# Patient Record
Sex: Male | Born: 1960 | Race: White | Hispanic: No | Marital: Married | Smoking: Former smoker
Health system: Southern US, Community
[De-identification: ages and names within clinical notes are randomized; demographics above are authoritative.]

## PROBLEM LIST (undated history)

## (undated) DIAGNOSIS — R55 Syncope and collapse: Secondary | ICD-10-CM

## (undated) DIAGNOSIS — I2510 Atherosclerotic heart disease of native coronary artery without angina pectoris: Secondary | ICD-10-CM

## (undated) DIAGNOSIS — I5032 Chronic diastolic (congestive) heart failure: Secondary | ICD-10-CM

## (undated) DIAGNOSIS — F1011 Alcohol abuse, in remission: Secondary | ICD-10-CM

## (undated) DIAGNOSIS — N200 Calculus of kidney: Secondary | ICD-10-CM

## (undated) DIAGNOSIS — E669 Obesity, unspecified: Secondary | ICD-10-CM

## (undated) DIAGNOSIS — R110 Nausea: Secondary | ICD-10-CM

## (undated) HISTORY — PX: GASTRIC BYPASS: SHX52

## (undated) HISTORY — PX: ULNAR NERVE TRANSPOSITION: SHX2595

## (undated) HISTORY — PX: REPLACEMENT TOTAL KNEE: SUR1224

## (undated) HISTORY — PX: LITHOTRIPSY: SUR834

---

## 2015-07-13 ENCOUNTER — Observation Stay
Admission: EM | Admit: 2015-07-13 | Discharge: 2015-07-14 | Disposition: A | Discharge status: Home / Self Care | Payer: BLUE CROSS/BLUE SHIELD | Attending: Internal Medicine | Admitting: Internal Medicine

## 2015-07-13 ENCOUNTER — Emergency Department: Payer: BLUE CROSS/BLUE SHIELD

## 2015-07-13 ENCOUNTER — Encounter: Payer: Self-pay | Admitting: Emergency Medicine

## 2015-07-13 DIAGNOSIS — R42 Dizziness and giddiness: Secondary | ICD-10-CM | POA: Insufficient documentation

## 2015-07-13 DIAGNOSIS — E876 Hypokalemia: Secondary | ICD-10-CM | POA: Diagnosis not present

## 2015-07-13 DIAGNOSIS — R002 Palpitations: Secondary | ICD-10-CM | POA: Diagnosis not present

## 2015-07-13 DIAGNOSIS — F1010 Alcohol abuse, uncomplicated: Secondary | ICD-10-CM | POA: Insufficient documentation

## 2015-07-13 DIAGNOSIS — Z801 Family history of malignant neoplasm of trachea, bronchus and lung: Secondary | ICD-10-CM | POA: Diagnosis not present

## 2015-07-13 DIAGNOSIS — R079 Chest pain, unspecified: Secondary | ICD-10-CM | POA: Diagnosis not present

## 2015-07-13 DIAGNOSIS — Z8249 Family history of ischemic heart disease and other diseases of the circulatory system: Secondary | ICD-10-CM | POA: Diagnosis not present

## 2015-07-13 DIAGNOSIS — R319 Hematuria, unspecified: Secondary | ICD-10-CM | POA: Diagnosis not present

## 2015-07-13 DIAGNOSIS — Z888 Allergy status to other drugs, medicaments and biological substances status: Secondary | ICD-10-CM | POA: Diagnosis not present

## 2015-07-13 DIAGNOSIS — R0781 Pleurodynia: Secondary | ICD-10-CM | POA: Diagnosis not present

## 2015-07-13 DIAGNOSIS — I2510 Atherosclerotic heart disease of native coronary artery without angina pectoris: Secondary | ICD-10-CM | POA: Diagnosis not present

## 2015-07-13 DIAGNOSIS — E669 Obesity, unspecified: Secondary | ICD-10-CM | POA: Diagnosis not present

## 2015-07-13 DIAGNOSIS — R55 Syncope and collapse: Secondary | ICD-10-CM | POA: Diagnosis present

## 2015-07-13 DIAGNOSIS — Z87442 Personal history of urinary calculi: Secondary | ICD-10-CM | POA: Insufficient documentation

## 2015-07-13 DIAGNOSIS — K219 Gastro-esophageal reflux disease without esophagitis: Secondary | ICD-10-CM | POA: Diagnosis not present

## 2015-07-13 DIAGNOSIS — Z79899 Other long term (current) drug therapy: Secondary | ICD-10-CM | POA: Insufficient documentation

## 2015-07-13 DIAGNOSIS — D649 Anemia, unspecified: Secondary | ICD-10-CM | POA: Diagnosis not present

## 2015-07-13 DIAGNOSIS — Z833 Family history of diabetes mellitus: Secondary | ICD-10-CM | POA: Insufficient documentation

## 2015-07-13 DIAGNOSIS — Z9884 Bariatric surgery status: Secondary | ICD-10-CM | POA: Insufficient documentation

## 2015-07-13 DIAGNOSIS — F329 Major depressive disorder, single episode, unspecified: Secondary | ICD-10-CM | POA: Insufficient documentation

## 2015-07-13 DIAGNOSIS — I5032 Chronic diastolic (congestive) heart failure: Secondary | ICD-10-CM | POA: Diagnosis not present

## 2015-07-13 DIAGNOSIS — R0602 Shortness of breath: Secondary | ICD-10-CM | POA: Diagnosis present

## 2015-07-13 DIAGNOSIS — Z87891 Personal history of nicotine dependence: Secondary | ICD-10-CM | POA: Diagnosis not present

## 2015-07-13 HISTORY — DX: Chronic diastolic (congestive) heart failure: I50.32

## 2015-07-13 HISTORY — DX: Obesity, unspecified: E66.9

## 2015-07-13 HISTORY — DX: Syncope and collapse: R55

## 2015-07-13 HISTORY — DX: Alcohol abuse, in remission: F10.11

## 2015-07-13 HISTORY — DX: Calculus of kidney: N20.0

## 2015-07-13 HISTORY — DX: Nausea: R11.0

## 2015-07-13 HISTORY — DX: Atherosclerotic heart disease of native coronary artery without angina pectoris: I25.10

## 2015-07-13 LAB — BASIC METABOLIC PANEL
Anion gap: 8 (ref 5–15)
BUN: 27 mg/dL — ABNORMAL HIGH (ref 6–20)
CALCIUM: 8.8 mg/dL — AB (ref 8.9–10.3)
CO2: 26 mmol/L (ref 22–32)
CREATININE: 0.69 mg/dL (ref 0.61–1.24)
Chloride: 104 mmol/L (ref 101–111)
GFR calc Af Amer: >60 mL/min (ref >60)
GFR calc non Af Amer: >60 mL/min (ref >60)
GLUCOSE: 106 mg/dL — AB (ref 65–99)
Potassium: 4.4 mmol/L (ref 3.5–5.1)
Sodium: 138 mmol/L (ref 135–145)

## 2015-07-13 LAB — URINALYSIS COMPLETE WITH MICROSCOPIC (ARMC ONLY)
BILIRUBIN URINE: NEGATIVE
GLUCOSE, UA: NEGATIVE mg/dL
Leukocytes, UA: NEGATIVE
Nitrite: NEGATIVE
PH: 5 (ref 5.0–8.0)
Protein, ur: 30 mg/dL — AB
Specific Gravity, Urine: 1.028 (ref 1.005–1.030)

## 2015-07-13 LAB — TROPONIN I
TROPONIN I: 0.04 ng/mL — AB (ref <0.031)
Troponin I: <0.03 ng/mL (ref <0.031)

## 2015-07-13 LAB — CBC
HCT: 35.6 % — ABNORMAL LOW (ref 40.0–52.0)
Hemoglobin: 11.5 g/dL — ABNORMAL LOW (ref 13.0–18.0)
MCH: 28 pg (ref 26.0–34.0)
MCHC: 32.3 g/dL (ref 32.0–36.0)
MCV: 86.8 fL (ref 80.0–100.0)
PLATELETS: 419 10*3/uL (ref 150–440)
RBC: 4.1 MIL/uL — ABNORMAL LOW (ref 4.40–5.90)
RDW: 14.5 % (ref 11.5–14.5)
WBC: 14.5 10*3/uL — ABNORMAL HIGH (ref 3.8–10.6)

## 2015-07-13 MED ORDER — DOCUSATE SODIUM 100 MG PO CAPS
100.0000 mg | ORAL_CAPSULE | Freq: Two times a day (BID) | ORAL | Status: DC
  Filled 2015-07-13: qty 1

## 2015-07-13 MED ORDER — ONDANSETRON HCL 4 MG/2ML IJ SOLN: 4.0000 mg | Freq: Four times a day (QID) | INTRAMUSCULAR | Status: DC | PRN

## 2015-07-13 MED ORDER — ENOXAPARIN SODIUM 40 MG/0.4ML ~~LOC~~ SOLN
40.0000 mg | Freq: Two times a day (BID) | SUBCUTANEOUS | Status: DC
  Filled 2015-07-13: qty 0.4

## 2015-07-13 MED ORDER — ASPIRIN 81 MG PO CHEW
CHEWABLE_TABLET | ORAL | Status: AC
  Filled 2015-07-13: qty 4

## 2015-07-13 MED ORDER — SODIUM CHLORIDE 0.9 % IJ SOLN
3.0000 mL | Freq: Two times a day (BID) | INTRAMUSCULAR | Status: DC
  Administered 2015-07-13: 3 mL via INTRAVENOUS

## 2015-07-13 MED ORDER — ONDANSETRON HCL 4 MG PO TABS: 4.0000 mg | ORAL_TABLET | Freq: Four times a day (QID) | ORAL | Status: DC | PRN

## 2015-07-13 MED ORDER — ASPIRIN 81 MG PO CHEW
324.0000 mg | CHEWABLE_TABLET | Freq: Once | ORAL | Status: AC
  Administered 2015-07-13: 324 mg via ORAL

## 2015-07-13 NOTE — ED Notes (Signed)
Pt was at work and had been dizzy intermittently.  Had syncopal episode at work today.  Unsure how long was out but boss witnessed. Coworkers told pt his eyes rolled back and he was pale and clammy.  Skin warm and dry in triage. Skin pink in color.

## 2015-07-13 NOTE — H&P (Signed)
[facility] - [place] [facility]   PATIENT NAME: Phillip Arroyo    MR#:  [id number]  DATE OF BIRTH:  12/25/1960  DATE OF ADMISSION:  07/13/2015  PRIMARY CARE PHYSICIAN: No primary care provider on file.   REQUESTING/REFERRING PHYSICIAN: [email or web address]  CHIEF COMPLAINT:   Dizziness and syncope HISTORY OF PRESENT ILLNESS:  Phillip Arroyo  is a 55 y.o. male with a known past medical history of alcohol abuse, currently on alcohol rehabilitation, last alcohol drink on December 1 is presenting to the ED with a chief complaint of dizziness and passing out. According to the patient he is a recovering alcoholic, his last drink was 47 days ago. He recently went through a 28 day inpatient detox. He states during his detox he had 2 falls in which he became near syncopal and fell. They had attributed this to over medication with Ativan. 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 Fell backwards, likely hit his head but denies any headache. Denies any chest pain at any time. Per the patient co-workers, he was pale and clammy at the time of passing out.Patient and wife were reporting that they usually don't take any prescribed medications but recently patient was started on baclofen and Lexapro following which she is feeling more dizzy. Patient had cardiac catheterization 6 years ago which was normal  PAST MEDICAL HISTORY:   Past Medical History  Diagnosis Date  . History of ETOH abuse   . Kidney stone     PAST SURGICAL HISTOIRY:   Past Surgical History  Procedure Laterality Date  . Gastric bypass    . Replacement total knee    . Lithotripsy    . Ulnar nerve transposition      SOCIAL HISTORY:   Social History  Substance Use Topics  . Smoking status: Former Games developer  . Smokeless tobacco: Not on file  . Alcohol Use: No     Comment: quit May 27 2015    FAMILY HISTORY:  Obesity runs in his  family  DRUG ALLERGIES:   Allergies  Allergen Reactions  . Ativan [Lorazepam] Other (See Comments)    Reaction:  Caused pts BP to drop   . Topiramate Nausea And Vomiting and Other (See Comments)    Reaction:  Dizziness   . Zoloft [Sertraline Hcl] Swelling  . Lorcet [Hydrocodone-Acetaminophen] Hives  . Gabapentin Other (See Comments)    Reaction:  Dizziness   . Levaquin [Levofloxacin] Other (See Comments)    Reaction:  Burning of stomach   . Prednisone Swelling  . Vistaril [Hydroxyzine Hcl] Rash    REVIEW OF SYSTEMS:  CONSTITUTIONAL: No fever, fatigue or weakness.  EYES: No blurred or double vision.  EARS, NOSE, AND THROAT: No tinnitus or ear pain.  RESPIRATORY: No cough, shortness of breath, wheezing or hemoptysis.  CARDIOVASCULAR: No chest pain, orthopnea, edema. Reporting dizziness and syncopized GASTROINTESTINAL: No nausea, vomiting, diarrhea or abdominal pain.  GENITOURINARY: No dysuria, hematuria.  ENDOCRINE: No polyuria, nocturia,  HEMATOLOGY: No anemia, easy bruising or bleeding SKIN: No rash or lesion. MUSCULOSKELETAL: No joint pain or arthritis.   NEUROLOGIC: No tingling, numbness, weakness.  PSYCHIATRY: No anxiety or depression.   MEDICATIONS AT HOME:   Prior to Admission medications   Medication Sig Start Date End Date Taking? Authorizing Provider  baclofen (LIORESAL) 10 MG tablet Take 10 mg by mouth 3 (three) times daily.   Yes Historical Provider, MD  escitalopram (LEXAPRO) 10 MG tablet Take 10 mg by mouth daily.   Yes Historical Provider, MD  Naltrexone (VIVITROL) 380 MG SUSR Inject 380 mg into the muscle every 30 (thirty) days.   Yes Historical Provider, MD      VITAL SIGNS:  Blood pressure 122/92, pulse 75, temperature 98.2 F (36.8 C), temperature source Oral, resp. rate 18, height  (1.88 m), weight 148.326 kg (327 lb), SpO2 100 %.  PHYSICAL EXAMINATION:  GENERAL:  55 y.o.-year-old patient lying in the bed with no acute distress.  EYES: Pupils  equal, round, reactive to light and accommodation. No scleral icterus. Extraocular muscles intact.  HEENT: Head atraumatic, normocephalic. Oropharynx and nasopharynx clear.  NECK:  Supple, no jugular venous distention. No thyroid enlargement, no tenderness.  LUNGS: Normal breath sounds bilaterally, no wheezing, rales,rhonchi or crepitation. No use of accessory muscles of respiration.  CARDIOVASCULAR: S1, S2 normal. No murmurs, rubs, or gallops.  ABDOMEN: Soft, nontender, nondistended. Bowel sounds present. No organomegaly or mass.  EXTREMITIES: No pedal edema, cyanosis, or clubbing.  NEUROLOGIC: Cranial nerves II through XII are intact. Muscle strength 5/5 in all extremities. Sensation intact. Gait not checked.  PSYCHIATRIC: The patient is alert and oriented x 3.  SKIN: No obvious rash, lesion, or ulcer.   LABORATORY PANEL:   CBC  Recent Labs Lab 07/13/15 1620  WBC 14.5*  HGB 11.5*  HCT 35.6*  PLT 419   ------------------------------------------------------------------------------------------------------------------  Chemistries   Recent Labs Lab 07/13/15 1620  NA 138  K 4.4  CL 104  CO2 26  GLUCOSE 106*  BUN 27*  CREATININE 0.69  CALCIUM 8.8*   ------------------------------------------------------------------------------------------------------------------  Cardiac Enzymes  Recent Labs Lab 07/13/15 1900  TROPONINI 0.04*   ------------------------------------------------------------------------------------------------------------------  RADIOLOGY:  No results found.  EKG:   Orders placed or performed during the hospital encounter of 07/13/15  . ED EKG  . ED EKG  . EKG 12-Lead  . EKG 12-Lead  . EKG 12-Lead  . EKG 12-Lead    IMPRESSION AND PLAN:   Assessment and plan  1. Syncope probably from baclofen and Lexapro adverse effects Admit to telemetry CT head is ordered which is pending at this time Chest x-ray 2 views is ordered which is pending at  this time Get orthostatic blood pressures Cycle cardiac biomarkers Monitor patient on telemetry Will obtain echocardiogram Discontinue baclofen Hold Lexapro at this time and consult placed to psychiatry for alternative options for depression medication   2. Palpitations and slight elevation of troponin Monitor patient on telemetry Cycle cardiac biomarkers and get echocardiogram Check TSH Cardiac consult is placed Patient is status post alcohol rehabilitation, last drink was on December 1 and getting naloxone shots once monthly  3. Urine dipstick with hemoglobin Get repeat urinalysis  4. Obesity Last modification with diet and exercises advised  5. History of alcohol abuse Getting outpatient alcohol rehabilitation, recommended to continue the same    All the records are reviewed and case discussed with ED provider. Management plans discussed with the patient, family and they are in agreement.  CODE STATUS: Full code, wife is the healthcare power of attorney  TOTAL TIME TAKING CARE OF THIS PATIENT: 45 minutes.    Ramonita Lab M.D on 07/13/2015 at 9:05 PM  Between 7am to 6pm - Pager - [phone]  After 6pm go to [email or web address] - password [email or web address] [facility]  [facility]  Office  [phone]  CC: Primary care physician; No primary care provider on file.

## 2015-07-13 NOTE — ED Provider Notes (Signed)
[facility] Emergency Department Provider Note  Time seen: 7:02 PM  I have reviewed the triage vital signs and the nursing notes.   HISTORY  Chief Complaint Loss of Consciousness    HPI Phillip Arroyo is a 55 y.o. male with a past medical history of alcohol abuse, who presents the emergency department after a syncopal episode. According to the patient he is a recovering alcoholic, his last drink was 47 days ago. He recently went through a 28 day inpatient detox. He states during his detox. He had 2 falls in which he became near syncopal and fell. They had attributed this to over medication with Ativan which could've been related to. 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 States he felt a little bit better so he stood up to walk away from the people he was with, states he sat down on a bench and then once again felt dizzy lightheaded and had a syncopal episode. Fell backwards, likely hit his head but denies any headache currently. Denies any chest pain at any time. Per the patient coworkers stated that he was pale and clammy at the time of passing out.     Past Medical History  Diagnosis Date  . History of ETOH abuse   . Kidney stone     There are no active problems to display for this patient.   Past Surgical History  Procedure Laterality Date  . Gastric bypass    . Replacement total knee    . Lithotripsy    . Ulnar nerve transposition      Current Outpatient Rx  Name  Route  Sig  Dispense  Refill  . baclofen (LIORESAL) 10 MG tablet   Oral   Take 10 mg by mouth 3 (three) times daily.         Marland Kitchen escitalopram (LEXAPRO) 10 MG tablet   Oral   Take 10 mg by mouth daily.           Allergies Ativan; Topiramate; Zoloft; Lorcet; Gabapentin; Levaquin; Prednisone; and Vistaril  No  family history on file.  Social History Social History  Substance Use Topics  . Smoking status: Former Games developer  . Smokeless tobacco: Not on file  . Alcohol Use: No     Comment: quit May 27 2015    Review of Systems Constitutional: Negative for fever. Cardiovascular: Negative for chest pain. Respiratory: Negative for shortness of breath. Gastrointestinal: Negative for abdominal pain Neurological: Negative for headache 10-point ROS otherwise negative.  ____________________________________________   PHYSICAL EXAM:  VITAL SIGNS: ED Triage Vitals  Enc Vitals Group     BP 07/13/15 1616 135/94 mmHg     Pulse Rate 07/13/15 1615 71     Resp 07/13/15 1615 16     Temp 07/13/15 1615 98.2 F (36.8 C)     Temp Source 07/13/15 1615 Oral     SpO2 07/13/15 1615 100 %     Weight 07/13/15 1615 327 lb (148.326 kg)     Height 07/13/15 1615  (1.88 m)     Head Cir --      Peak Flow --      Pain Score 07/13/15 1616 4     Pain Loc --      Pain Edu? --      Excl. in  GC? --     Constitutional: Alert and oriented. Well appearing and in no distress. Eyes: Normal exam ENT   Head: Normocephalic and atraumatic   Mouth/Throat: Mucous membranes are moist. Cardiovascular: Normal rate, regular rhythm. No murmur Respiratory: Normal respiratory effort without tachypnea nor retractions. Breath sounds are clear and equal bilaterally. No wheezes/rales/rhonchi. Gastrointestinal: Soft and nontender. No distention.   Musculoskeletal: Nontender with normal range of motion in all extremities. Neurologic:  Normal speech and language. No gross focal neurologic deficits  Skin:  Skin is warm, dry and intact.  Psychiatric: Mood and affect are normal. Speech and behavior are normal.  ____________________________________________    EKG  EKG reviewed and interpreted, so shows normal sinus rhythm at 61 bpm, narrow QRS, normal axis, normal intervals, no ST changes. Normal EKG.  EKG #2 due to and  interpreted by myself shows normal sinus rhythm at 71 bpm, narrow QRS, normal axis, normal intervals, no ST changes. Largely unchanged from first EKG.  ____________________________________________    INITIAL IMPRESSION / ASSESSMENT AND PLAN / ED COURSE  Pertinent labs & imaging results that were available during my care of the patient were reviewed by me and considered in my medical decision making (see chart for details).  Patient presents the emergency department after syncopal episode at 2:30 PM today. Patient's symptoms are suggestive of an orthostatic component. Patient denies any symptoms in the emergency department. Denies any chest pain at any time. Patient's labs are within normal limits, we'll repeat a troponin at this time now approximate 5 hours after the incident. Patient's EKG is reassuring. I discussed with the patient if his second troponin is negative he will be able to be discharged home but he will need a follow-up with cardiology for a Holter monitor test. The patient is agreeable to this plan.  Patient's second troponin is slightly elevated 0.04. Patient states he once again became symptomatic in the emergency department, no changes evident on EKG. We will admit the patient for continued evaluation and treatment. We will dose aspirin in the emergency department. ____________________________________________   FINAL CLINICAL IMPRESSION(S) / ED DIAGNOSES  Syncope   Minna Antis, MD 07/13/15 2022

## 2015-07-13 NOTE — ED Notes (Signed)
Pt sitting at bedside, wife in room. Pt making jokes and laughing, appears in no distress.

## 2015-07-13 NOTE — ED Notes (Signed)
Lab called with troponin of 0.04  Dr Lenard Lance aware.

## 2015-07-13 NOTE — ED Notes (Signed)
Iv started by [employer].  Pt preference for iv in left ac.  Pt tolerated well.  Family with pt.

## 2015-07-13 NOTE — Progress Notes (Signed)
DREUX MCGROARTY is a 55 y.o. male patient admitted from ED awake, alert - oriented  X 4 - no acute distress noted.  VSS - Blood pressure 116/76, pulse 65, temperature 98.4 F (36.9 C), temperature source Oral, resp. rate 18, height  (1.88 m), weight 148.326 kg (327 lb), SpO2 100 %.    IV in place, occlusive dsg intact without redness.  Orientation to room, and floor completed with information packet given to patient/family. Admission INP armband ID verified with patient/family, and in place.   SR up x 2, fall assessment complete, with patient and family able to verbalize understanding of risk associated with falls, and verbalized understanding to call nsg before up out of bed.  Call light within reach, patient able to voice, and demonstrate understanding.    Skin assessed with Nehemiah Settle, RN.     Will cont to eval and treat per MD orders.  Syliva Overman, RN

## 2015-07-14 ENCOUNTER — Encounter: Payer: Self-pay | Admitting: Physician Assistant

## 2015-07-14 ENCOUNTER — Observation Stay (HOSPITAL_BASED_OUTPATIENT_CLINIC_OR_DEPARTMENT_OTHER)
Admit: 2015-07-14 | Discharge: 2015-07-14 | Disposition: A | Discharge status: Home / Self Care | Payer: BLUE CROSS/BLUE SHIELD | Attending: Internal Medicine | Admitting: Internal Medicine

## 2015-07-14 DIAGNOSIS — F1010 Alcohol abuse, uncomplicated: Secondary | ICD-10-CM | POA: Diagnosis not present

## 2015-07-14 DIAGNOSIS — R319 Hematuria, unspecified: Secondary | ICD-10-CM | POA: Insufficient documentation

## 2015-07-14 DIAGNOSIS — R55 Syncope and collapse: Secondary | ICD-10-CM

## 2015-07-14 DIAGNOSIS — D649 Anemia, unspecified: Secondary | ICD-10-CM | POA: Diagnosis not present

## 2015-07-14 LAB — COMPREHENSIVE METABOLIC PANEL
ALBUMIN: 2.9 g/dL — AB (ref 3.5–5.0)
ALK PHOS: 64 U/L (ref 38–126)
ALT: 11 U/L — AB (ref 17–63)
AST: 19 U/L (ref 15–41)
Anion gap: 5 (ref 5–15)
BILIRUBIN TOTAL: 0.9 mg/dL (ref 0.3–1.2)
BUN: 23 mg/dL — AB (ref 6–20)
CALCIUM: 8.6 mg/dL — AB (ref 8.9–10.3)
CO2: 28 mmol/L (ref 22–32)
CREATININE: 0.68 mg/dL (ref 0.61–1.24)
Chloride: 107 mmol/L (ref 101–111)
GFR calc Af Amer: >60 mL/min (ref >60)
GLUCOSE: 96 mg/dL (ref 65–99)
Potassium: 3.7 mmol/L (ref 3.5–5.1)
Sodium: 140 mmol/L (ref 135–145)
TOTAL PROTEIN: 6.1 g/dL — AB (ref 6.5–8.1)

## 2015-07-14 LAB — URINALYSIS COMPLETE WITH MICROSCOPIC (ARMC ONLY)
Bacteria, UA: NONE SEEN
Bilirubin Urine: NEGATIVE
Glucose, UA: NEGATIVE mg/dL
Ketones, ur: NEGATIVE mg/dL
Leukocytes, UA: NEGATIVE
Nitrite: NEGATIVE
PH: 5 (ref 5.0–8.0)
PROTEIN: NEGATIVE mg/dL
Specific Gravity, Urine: 1.029 (ref 1.005–1.030)

## 2015-07-14 LAB — CBC
HCT: 30.1 % — ABNORMAL LOW (ref 40.0–52.0)
HEMATOCRIT: 29.8 % — AB (ref 40.0–52.0)
Hemoglobin: 9.9 g/dL — ABNORMAL LOW (ref 13.0–18.0)
Hemoglobin: 9.9 g/dL — ABNORMAL LOW (ref 13.0–18.0)
MCH: 28.6 pg (ref 26.0–34.0)
MCH: 28.5 pg (ref 26.0–34.0)
MCHC: 33.2 g/dL (ref 32.0–36.0)
MCHC: 33 g/dL (ref 32.0–36.0)
MCV: 86.2 fL (ref 80.0–100.0)
MCV: 86.4 fL (ref 80.0–100.0)
PLATELETS: 346 10*3/uL (ref 150–440)
PLATELETS: 363 10*3/uL (ref 150–440)
RBC: 3.45 MIL/uL — ABNORMAL LOW (ref 4.40–5.90)
RBC: 3.49 MIL/uL — AB (ref 4.40–5.90)
RDW: 14.4 % (ref 11.5–14.5)
RDW: 14.3 % (ref 11.5–14.5)
WBC: 11 10*3/uL — AB (ref 3.8–10.6)
WBC: 11.2 10*3/uL — AB (ref 3.8–10.6)

## 2015-07-14 LAB — TROPONIN I: Troponin I: <0.03 ng/mL (ref <0.031)

## 2015-07-14 LAB — TSH: TSH: 0.696 u[IU]/mL (ref 0.350–4.500)

## 2015-07-14 LAB — GLUCOSE, CAPILLARY: GLUCOSE-CAPILLARY: 97 mg/dL (ref 65–99)

## 2015-07-14 MED ORDER — OMEPRAZOLE MAGNESIUM 20 MG PO TBEC: 20.0000 mg | DELAYED_RELEASE_TABLET | Freq: Every day | ORAL | Status: AC

## 2015-07-14 NOTE — Progress Notes (Signed)
Patient discharged via wheelchair and private vehicle. IV removed and catheter intact. All discharge instructions given and patient verbalizes understanding. Tele removed and returned. No prescriptions given to patient No distress noted.   

## 2015-07-14 NOTE — Discharge Instructions (Addendum)
DIET:  Regular diet  DISCHARGE CONDITION:  Stable  ACTIVITY:  Activity as tolerated  OXYGEN:  Home Oxygen: No.   Oxygen Delivery: room air  DISCHARGE LOCATION:  home   If you experience worsening of your admission symptoms, develop shortness of breath, life threatening emergency, suicidal or homicidal thoughts you must seek medical attention immediately by calling 911 or calling your MD immediately  if symptoms less severe.  You Must read complete instructions/literature along with all the possible adverse reactions/side effects for all the Medicines you take and that have been prescribed to you. Take any new Medicines after you have completely understood and accpet all the possible adverse reactions/side effects.   Please note  You were cared for by a hospitalist during your hospital stay. If you have any questions about your discharge medications or the care you received while you were in the hospital after you are discharged, you can call the unit and asked to speak with the hospitalist on call if the hospitalist that took care of you is not available. Once you are discharged, your primary care physician will handle any further medical issues. Please note that NO REFILLS for any discharge medications will be authorized once you are discharged, as it is imperative that you return to your primary care physician (or establish a relationship with a primary care physician if you do not have one) for your aftercare needs so that they can reassess your need for medications and monitor your lab values.   Take lexapro every other day and stop after 1 week.  Omeprazole suspension What is this medicine? OMEPRAZOLE (oh ME pray zol) prevents the production of acid in the stomach. It is used to treat gastroesophageal reflux disease (GERD), ulcers, certain bacteria in the stomach, inflammation of the esophagus, and Zollinger-Ellison Syndrome. It is also used to treat other conditions that cause  too much stomach acid. This medicine may be used for other purposes; ask your health care provider or pharmacist if you have questions. What should I tell my health care provider before I take this medicine? They need to know if you have any of these conditions: -liver disease -low levels of magnesium in the blood -an unusual or allergic reaction to omeprazole, other medicines, foods, dyes, or preservatives -pregnant or trying to get pregnant -breast-feeding How should I use this medicine? Take this medicine by mouth with water. Empty the contents of 1 packet into a container of water. The package your medicine comes in will tell you how much water to use. Stir gently and allow 2 to 3 minutes to thicken. Stir again and drink the medicine. Drink it within 30 minutes after mixing. If any medicine remains after drinking, add more water, stir, and drink at once. This medicine works best if taken on an empty stomach 30 to 60 minutes before breakfast. Take your doses at regular intervals. Do not take your medicine more often than directed. Talk to your pediatrician regarding the use of this medicine in children. Special care may be needed. Overdosage: If you think you have taken too much of this medicine contact a poison control center or emergency room at once. NOTE: This medicine is only for you. Do not share this medicine with others. What if I miss a dose? If you miss a dose, take it as soon as you can. If it is almost time for your next dose, take only that dose. Do not take double or extra doses. What may interact with this medicine?  Do not take this medicine with any of the following medications: -atazanavir -clopidogrel -nelfinavir This medicine may also interact with the following medications: -ampicillin -certain medicines for anxiety or sleep -certain medicines that treat or prevent blood clots like warfarin -cyclosporine -diazepam -digoxin -disulfiram -diuretics -iron  salts -methotrexate -mycophenolate mofetil -phenytoin -prescription medicine for fungal or yeast infection like itraconazole, ketoconazole, voriconazole -saquinavir -tacrolimus This list may not describe all possible interactions. Give your health care provider a list of all the medicines, herbs, non-prescription drugs, or dietary supplements you use. Also tell them if you smoke, drink alcohol, or use illegal drugs. Some items may interact with your medicine. What should I watch for while using this medicine? It can take several days before your stomach pain gets better. Check with your doctor or health care professional if your condition does not start to get better, or if it gets worse. You may need blood work done while you are taking this medicine. What side effects may I notice from receiving this medicine? Side effects that you should report to your doctor or health care professional as soon as possible: -allergic reactions like skin rash, itching or hives, swelling of the face, lips, or tongue -bone, muscle or joint pain -breathing problems -chest pain or chest tightness -dark yellow or brown urine -dizziness -fast, irregular heartbeat -feeling faint or lightheaded -fever or sore throat -muscle spasm -palpitations -redness, blistering, peeling or loosening of the skin, including inside the mouth -seizures -tremors -unusual bleeding or bruising -unusually weak or tired -yellowing of the eyes or skin Side effects that usually do not require medical attention (Report these to your doctor or health care professional if they continue or are bothersome.): -constipation -diarrhea -dry mouth -headache -nausea This list may not describe all possible side effects. Call your doctor for medical advice about side effects. You may report side effects to FDA at 1-800-FDA-1088. Where should I keep my medicine? Keep out of the reach of children. Store at room temperature between 15 and  30 degrees C (59 and 86 degrees F). Protect from light and moisture. Throw away any unused medicine after the expiration date. NOTE: This sheet is a summary. It may not cover all possible information. If you have questions about this medicine, talk to your doctor, pharmacist, or health care provider.    2016, [employer]vier/[employer] Standard. (2013-12-08 10:14:21)

## 2015-07-14 NOTE — Consult Note (Signed)
Cardiology Consultation Note  Patient ID: Phillip Arroyo, MRN: [id number], DOB/AGE: Oct 18, 1960 55 y.o. Admit date: 07/13/2015   Date of Consult: 07/14/2015 Primary Physician: [facility] Primary Cardiologist: [facility]  Chief Complaint: Dizziness Reason for Consult: Syncope  HPI: 55 y.o. 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    He was previously followed by [facility] cardiology. In December 01, 2022 on 2015 he was experiencing some LE edema in the setting hypokalemia as low as 2.4 coupled with chest pain. He underwent a nuclear stress test that was abnormal subsequently leading to a cardiac cath on 12/19/2013 that showed mild ostial LM 20% stenosis, otherwise no significant coronary disease. EF estimated at 65%, elevated LVEDP at 23 mm Hg. Medical management was advised. Per the patient this cath was unchanged from his prior cath 6 years previously. Since discontinuing ibuprofen his LE swelling has resolved.   He decided to enter inpatient detox in December 2016. While inpatient he reports having 2 syncopal episodes. No associated chest pain, tachy-palpitations, increased dyspnea, diaphoresis, nausea, or vomiting associated with these episodes. He was receiving Ativan at the time and they were attributed to this medication. Ultimately, he was discharged from inpatient rehabd and has stayed sober since. He has been on baclofen and Lexapro as part of his regimen. On 1/17 he had just taken a baclofen, ate a small lunch, and was walking around his job site. He then began to feel dizzy and weak. This was followed by him passing out backwards. No associated chest pain, tachy-palpitations, diaphoresis, or nausea as above. He was brought  to  [facility] for further evaluation.   He also reports only being able to eat small, frequent meals 2/2 GI upset and getting full quickly. This is not a new issue for him. No LEE or orthopnea. He does not eat red meat. He does not check his BP at home.   Upon the patient's arrival to [facility] they were found to have troponin 0.04-->0.03-->0.03, albumin 2.9, total protein 6.1, SCr 0.68, K+ 4.4-->3.7, WBC 14.5-->11.0, hgb 11.5-->9.9, UA with 2+ hgb . ECG showed NSR, 71 bpm, anterolateral st depression, CXR showed no active disease. CT head without acute intracranial process. Orthostatics were negative without receiving IV fluids. He has remained in sinus rhythm to sinus bradycardia on telemetry. He is currently asymptomatic and wants to ambulate in the hallway.     Past Medical History  Diagnosis Date  . History of ETOH abuse     a. inpatient detox 05/2015  . Kidney stone   . Coronary artery disease, non-occlusive     a. cardiac cath November 30, 2013: ostial LM mild tapering 20%, o/w nl, EF 65%, elevated LVEDP at 23 mm Hg   . Nausea     a. history of significant GI upset with possible gastritis vs ulcer per patient and family   . Syncope 05/2015 & 06/2015    a. ? medication induced   . Obesity     a. s/p gastric bypass 2011  . Chronic diastolic CHF (congestive heart failure) (HCC)     a. echo 09/2013: EF >55%, GR2DD, moderate LVH, no significant valve dz, technically difficult study 2/2 body habitus      Most Recent Cardiac Studies: Cardiac Catheterization 2013/11/30: Impression: 1.  Mild tapering of 20% in the ostial and proximal left main coronary  artery with otherwise normal coronary arteries. 2. Left ventricle systolic function with an ejection fraction of 65% and  no mitral regurgitation visualized. 3. Elevated left ventricular end-diastolic filling pressure at 23 mmHg. Recommendations: The patient will continue to do medical therapy to  optimize cardiovascular risk factors. His chest pain is not likely    related to an ischemic etiology. He did have significant GI upset with  his recent viral illness and this may have accounted for his chest pain.  Echo 09/2013: INTERPRETATION(S)  1. Normal left ventricular size and function. LVEF>55% 2. Grade 2 diastolic dysfunction 3. Moderate left ventricular hypertrophy 4. No significant valve disease 5. Technically difficult due to body habitus   Surgical History:  Past Surgical History  Procedure Laterality Date  . Gastric bypass    . Replacement total knee    . Lithotripsy    . Ulnar nerve transposition       Home Meds: Prior to Admission medications   Medication Sig Start Date End Date Taking? Authorizing Provider  baclofen (LIORESAL) 10 MG tablet Take 10 mg by mouth 3 (three) times daily.   Yes Historical Provider, MD  escitalopram (LEXAPRO) 10 MG tablet Take 10 mg by mouth daily.   Yes Historical Provider, MD  Naltrexone (VIVITROL) 380 MG SUSR Inject 380 mg into the muscle every 30 (thirty) days.   Yes Historical Provider, MD    Inpatient Medications:  . docusate sodium  100 mg Oral BID  . enoxaparin (LOVENOX) injection  40 mg Subcutaneous BID  . sodium chloride  3 mL Intravenous Q12H      Allergies:  Allergies  Allergen Reactions  . Ativan [Lorazepam] Other (See Comments)    Reaction:  Caused pts BP to drop   . Topiramate Nausea And Vomiting and Other (See Comments)    Reaction:  Dizziness   . Zoloft [Sertraline Hcl] Swelling  . Lorcet [Hydrocodone-Acetaminophen] Hives  . Gabapentin Other (See Comments)    Reaction:  Dizziness   . Levaquin [Levofloxacin] Other (See Comments)    Reaction:  Burning of stomach   . Prednisone Swelling  . Vistaril [Hydroxyzine Hcl] Rash    Social History   Social History  . Marital Status: Married    Spouse Name: N/A  . Number of Children: N/A  . Years of Education: N/A   Occupational History  . Not on file.   Social History Main Topics  . Smoking status: Former Smoker    Quit  date: 07/13/1983  . Smokeless tobacco: Not on file  . Alcohol Use: No     Comment: quit May 27 2015  . Drug Use: No  . Sexual Activity: Not on file   Other Topics Concern  . Not on file   Social History Narrative     Family History  Problem Relation Age of Onset  . Lung cancer Mother   . Diabetes Mellitus II Mother   . CAD Father      Review of Systems: Review of Systems  Constitutional: Positive for malaise/fatigue. Negative for fever, chills, weight loss and diaphoresis.  HENT: Negative for congestion.   Eyes: Negative for discharge and redness.  Respiratory: Negative for cough, sputum production, shortness of breath and wheezing.   Cardiovascular: Negative for chest pain, palpitations, orthopnea, claudication, leg swelling and PND.  Gastrointestinal: Negative for nausea, vomiting and abdominal pain.  Musculoskeletal: Positive for falls. Negative for myalgias, back pain, joint pain  and neck pain.  Skin: Negative for rash.  Neurological: Positive for dizziness, loss of consciousness and weakness. Negative for tingling, tremors, sensory change, speech change, focal weakness and seizures.  Endo/Heme/Allergies: Does not bruise/bleed easily.  Psychiatric/Behavioral: Negative for depression, suicidal ideas, hallucinations and substance abuse. The patient is not nervous/anxious.   All other systems reviewed and are negative.    Labs:  Recent Labs  07/13/15 1900 07/13/15 2303 07/14/15 0426 07/14/15 1037  TROPONINI 0.04* <0.03 <0.03 <0.03   Lab Results  Component Value Date   WBC 11.0* 07/14/2015   HGB 9.9* 07/14/2015   HCT 29.8* 07/14/2015   MCV 86.2 07/14/2015   PLT 346 07/14/2015     Recent Labs Lab 07/14/15 0426  NA 140  K 3.7  CL 107  CO2 28  BUN 23*  CREATININE 0.68  CALCIUM 8.6*  PROT 6.1*  BILITOT 0.9  ALKPHOS 64  ALT 11*  AST 19  GLUCOSE 96   No results found for: CHOL, HDL, LDLCALC, TRIG No results found for: DDIMER  Radiology/Studies:  Dg  Chest 2 View  07/13/2015  CLINICAL DATA:  Intermittent dizziness, shortness of Breath, syncope. EXAM: CHEST  2 VIEW COMPARISON:  None. FINDINGS: Heart and mediastinal contours are within normal limits. No focal opacities or effusions. No acute bony abnormality. IMPRESSION: No active cardiopulmonary disease. Electronically Signed   By: Charlett Nose M.D.   On: 07/13/2015 21:14   Ct Head Wo Contrast  07/13/2015  CLINICAL DATA:  Dizziness with syncopal episode EXAM: CT HEAD WITHOUT CONTRAST TECHNIQUE: Contiguous axial images were obtained from the base of the skull through the vertex without intravenous contrast. COMPARISON:  None. FINDINGS: The ventricles are normal in size and configuration. There is no intracranial mass, hemorrhage, extra-axial fluid collection, or midline shift. The gray-white compartments are normal. No acute infarct evident. Bony calvarium appears intact. The visualized mastoid air cells are clear. No intraorbital lesions are identified. IMPRESSION: Study within normal limits. Electronically Signed   By: Bretta Bang III M.D.   On: 07/13/2015 21:24    EKG: NSR, 71 bpm, anterolateral st depression  Weights: Filed Weights   07/13/15 1615 07/14/15 0410  Weight: 327 lb (148.326 kg) 338 lb 12.8 oz (153.679 kg)     Physical Exam: Blood pressure 113/62, pulse 68, temperature 98 F (36.7 C), temperature source Oral, resp. rate 20, height 6\' 2"  (1.88 m), weight 338 lb 12.8 oz (153.679 kg), SpO2 100 %. Body mass index is 43.48 kg/(m^2). General: Well developed, well nourished, in no acute distress. Head: Normocephalic, atraumatic, sclera non-icteric, no xanthomas, nares are without discharge.  Neck: Negative for carotid bruits. JVD not elevated. Lungs: Clear bilaterally to auscultation without wheezes, rales, or rhonchi. Breathing is unlabored. Heart: RRR with S1 S2. No murmurs, rubs, or gallops appreciated. Abdomen: Obese, soft, non-tender, non-distended with normoactive bowel  sounds. No hepatomegaly. No rebound/guarding. No obvious abdominal masses. Msk:  Strength and tone appear normal for age. Extremities: No clubbing or cyanosis. No edema.  Distal pedal pulses are 2+ and equal bilaterally. Neuro: Alert and oriented X 3. No facial asymmetry. No focal deficit. Moves all extremities spontaneously. Psych:  Responds to questions appropriately with a normal affect.    Assessment and Plan:   1. Syncope: -Patient with recent nonobstructive cardiac cath as above -Per Dr. Mariah Milling, echo showed normal LV systolic function -Telemetry thus far has not shown any significant arrhythmia or pause. Continue to monitor while inpatient -Recommend 30 day event monitor to evaluate for  significant arrhythmia or pause  -Quite possibly in the setting of his baclofen which has an adverse effect of syncope. Would hold this medication at this time. It is being used to treat both his rib pain and curb his desire to consume alcohol. Would consider alternative medications at this time, though avoid narcotics  -Elevated troponin of 0.04, subsequently negative is likely not diagnostic in this case and in the setting of his syncope. Given his recent nonobstructive cath couple with normal echo this admission would not pursue ischemic work up at this time  2. Acute anemia: -Prior hgb from 12/2012 of 17.4;  01/2013 of 14.9;  09/2013 of 15.2 -Admitted with a hgb of 11.5-->9.9 -Has not received IV fluids leading to hemodilution  -Complains of GI upset/reflux -?bleed -Could check another cbc today to trend   -Per IM  3. Hematuria: -2+ blood on UA -Asymptomatic  -? Playing a role in the above -Per IM    4. Obesity s/p gastric bypass in 2011: -Health diet  -History of malabsorption issues    5. History of alcohol abuse: -Sober since 05/2015   SignedEula Listen, PA-C Pager: [phone] 07/14/2015, 11:20 AM

## 2015-07-14 NOTE — Progress Notes (Signed)
*  PRELIMINARY RESULTS* Echocardiogram 2D Echocardiogram has been performed.  Georgann Housekeeper Hege 07/14/2015, 10:01 AM

## 2015-07-14 NOTE — Evaluation (Signed)
Physical Therapy Evaluation Patient Details Name: Phillip Arroyo MRN: [id number] DOB: 08/21/1960 Today's Date: 07/14/2015   History of Present Illness  Pt is a 55 y.o. male presenting to hospital after dizziness and syncope at work.  CT of head negative.  PMH includes h/o ETOH abuse, TKR, and gastric bypass.  Clinical Impression  Pt demonstrates steady independent functional mobility without AD.  No loss of balance during session and no c/o dizziness/lightheadedness or any other adverse symptom during session.  Pt appears safe to discharge home with support of family.  No PT needs identified.  Discharge from PT in house (will complete PT order).    Follow Up Recommendations No PT follow up    Equipment Recommendations  None recommended by PT    Recommendations for Other Services       Precautions / Restrictions Precautions Precautions: Fall Restrictions Weight Bearing Restrictions: No      Mobility  Bed Mobility Overal bed mobility: Independent                Transfers Overall transfer level: Independent               General transfer comment: steady without loss of balance  Ambulation/Gait Ambulation/Gait assistance: Independent Ambulation Distance (Feet): 220 Feet Assistive device: None Gait Pattern/deviations: WFL(Within Functional Limits)   Gait velocity interpretation: at or above normal speed for age/gender General Gait Details: steady without loss of balance  Stairs Stairs: Yes Stairs assistance: Modified independent (Device/Increase time) Stair Management: One rail Left Number of Stairs: 1 General stair comments: steady without loss of balance  Wheelchair Mobility    Modified Rankin (Stroke Patients Only)       Balance Overall balance assessment: Independent                                           Pertinent Vitals/Pain Pain Assessment: No/denies pain  Vitals stable and WFL throughout treatment session.     Home Living Family/patient expects to be discharged to:: Private residence Living Arrangements: Spouse/significant other Available Help at Discharge: Family Type of Home: House Home Access: Stairs to enter   [employer] of Steps: 1 with pillar on L to hold onto Home Layout: One level Home Equipment: None      Prior Function Level of Independence: Independent         Comments: Pt works in [employer].     Hand Dominance        Extremity/Trunk Assessment   Upper Extremity Assessment: Overall WFL for tasks assessed           Lower Extremity Assessment: Overall WFL for tasks assessed      Cervical / Trunk Assessment: Normal  Communication   Communication: No difficulties  Cognition Arousal/Alertness: Awake/alert Behavior During Therapy: WFL for tasks assessed/performed Overall Cognitive Status: Within Functional Limits for tasks assessed                      General Comments   Nursing cleared pt for participation in physical therapy.  Pt agreeable to PT session. Pt's wife present during session.    Exercises        Assessment/Plan    PT Assessment Patent does not need any further PT services  PT Diagnosis     PT Problem List    PT Treatment Interventions     PT Goals (Current  goals can be found in the Care Plan section) Acute Rehab PT Goals Patient Stated Goal: to go home PT Goal Formulation: With patient Time For Goal Achievement: 07/28/15 Potential to Achieve Goals: Good    Frequency     Barriers to discharge        Co-evaluation               End of Session Equipment Utilized During Treatment: Gait belt Activity Tolerance: Patient tolerated treatment well Patient left: in bed;with call bell/phone within reach;with family/visitor present Nurse Communication: Mobility status;Precautions    Functional Assessment Tool Used: AM-PAC Functional Limitation: Mobility: Walking and moving around Mobility: Walking  and Moving Around Current Status [phone]): 0 percent impaired, limited or restricted Mobility: Walking and Moving Around Goal Status [phone]): 0 percent impaired, limited or restricted Mobility: Walking and Moving Around Discharge Status [phone]): 0 percent impaired, limited or restricted    Time: 9629-5284 PT Time Calculation (min) (ACUTE ONLY): 12 min   Charges:   PT Evaluation $PT Eval Low Complexity: 1 Procedure     PT G Codes:   PT G-Codes **NOT FOR INPATIENT CLASS** Functional Assessment Tool Used: AM-PAC Functional Limitation: Mobility: Walking and moving around Mobility: Walking and Moving Around Current Status (X3244): 0 percent impaired, limited or restricted Mobility: Walking and Moving Around Goal Status (W1027): 0 percent impaired, limited or restricted Mobility: Walking and Moving Around Discharge Status (O5366): 0 percent impaired, limited or restricted    [facility] Korry Dalgleish 07/14/2015, 11:40 AM Hendricks Limes, PT [phone]

## 2015-07-15 NOTE — Discharge Summary (Signed)
[facility] - [place] at [facility] [place]   PATIENT NAME: Phillip Arroyo    MR#:  [id number]  DATE OF BIRTH:  05/28/1961  DATE OF ADMISSION:  07/13/2015 ADMITTING PHYSICIAN: Ramonita Lab, MD  DATE OF DISCHARGE: 07/14/2015  2:28 PM  PRIMARY CARE PHYSICIAN: No primary care provider on file.    ADMISSION DIAGNOSIS:  Shortness of breath [R06.02] Syncope [R55] Syncope, unspecified syncope type [R55]  DISCHARGE DIAGNOSIS:  Active Problems:   Syncope   Faintness   Alcohol abuse   Absolute anemia   Hematuria   SECONDARY DIAGNOSIS:   Past Medical History  Diagnosis Date  . History of ETOH abuse     a. inpatient detox 05/2015  . Kidney stone   . Coronary artery disease, non-occlusive     a. cardiac cath 11/2013: ostial LM mild tapering 20%, o/w nl, EF 65%, elevated LVEDP at 23 mm Hg   . Nausea     a. history of significant GI upset with possible gastritis vs ulcer per patient and family   . Syncope 05/2015 & 06/2015    a. ? medication induced   . Obesity     a. s/p gastric bypass 2011  . Chronic diastolic CHF (congestive heart failure) (HCC)     a. echo 09/2013: EF >55%, GR2DD, moderate LVH, no significant valve dz, technically difficult study 2/2 body habitus     ADMITTING HISTORY  Menachem Urbanek is a 55 y.o. male with a known past medical history of alcohol abuse, currently on alcohol rehabilitation, last alcohol drink on December 1 is presenting to the ED with a chief complaint of dizziness and passing out. According to the patient he is a recovering alcoholic, his last drink was 47 days ago. He recently went through a 28 day inpatient detox. He states during his detox he had 2 falls in which he became near syncopal and fell. They had attributed this to over medication with Ativan. Today while patient was at work at a [employer] site he began feeling dizzy and lightheaded and had a syncopal episode. Fell backwards, likely hit his head but denies any  headache. Denies any chest pain at any time. Per the patient co-workers, he was pale and clammy at the time of passing out.Patient and wife were reporting that they usually don't take any prescribed medications but recently patient was started on baclofen and Lexapro following which she is feeling more dizzy. Patient had cardiac catheterization 6 years ago which was normal  HOSPITAL COURSE:   Patient was admitted onto telemetry floor. Echocardiogram was normal. Troponin checked 3 times was in the normal range. Patient was seen by cardiology who suggested likely cause of syncope could be baclofen. Also patient is being set up with a Holter monitor by Dr. Mariah Milling. Baclofen has been stopped at discharge. Also patient has been wanting to stop his Lexapro. However advised him to take it every other day and stop it after one week. He needs to follow-up with his primary care physician regarding further treatment. Follow-up with cardiology in 1-2 weeks.  No further dizziness after stopping baclofen in the hospital. Discharged home in stable condition.   CONSULTS OBTAINED:  Treatment Team:  Antonieta Iba, MD Audery Amel, MD  DRUG ALLERGIES:   Allergies  Allergen Reactions  . Ativan [Lorazepam] Other (See Comments)    Reaction:  Caused pts BP to drop   . Topiramate Nausea And Vomiting and Other (See Comments)    Reaction:  Dizziness   .  Zoloft [Sertraline Hcl] Swelling  . Lorcet [Hydrocodone-Acetaminophen] Hives  . Gabapentin Other (See Comments)    Reaction:  Dizziness   . Levaquin [Levofloxacin] Other (See Comments)    Reaction:  Burning of stomach   . Prednisone Swelling  . Vistaril [Hydroxyzine Hcl] Rash    DISCHARGE MEDICATIONS:   Discharge Medication List as of 07/14/2015  1:29 PM    START taking these medications   Details  omeprazole (PRILOSEC OTC) 20 MG tablet Take 1 tablet (20 mg total) by mouth daily., Starting 07/14/2015, Until Discontinued, No Print      CONTINUE these  medications which have NOT CHANGED   Details  escitalopram (LEXAPRO) 10 MG tablet Take 10 mg by mouth daily., Until Discontinued, Historical Med    Naltrexone (VIVITROL) 380 MG SUSR Inject 380 mg into the muscle every 30 (thirty) days., Until Discontinued, Historical Med      STOP taking these medications     baclofen (LIORESAL) 10 MG tablet          Today    VITAL SIGNS:  Blood pressure 113/62, pulse 68, temperature 98 F (36.7 C), temperature source Oral, resp. rate 20, height  (1.88 m), weight 153.679 kg (338 lb 12.8 oz), SpO2 100 %.  I/O:  No intake or output data in the 24 hours ending 07/15/15 1812  PHYSICAL EXAMINATION:  Physical Exam  GENERAL:  55 y.o.-year-old patient lying in the bed with no acute distress.  LUNGS: Normal breath sounds bilaterally, no wheezing, rales,rhonchi or crepitation. No use of accessory muscles of respiration.  CARDIOVASCULAR: S1, S2 normal. No murmurs, rubs, or gallops.  ABDOMEN: Soft, non-tender, non-distended. Bowel sounds present. No organomegaly or mass.  NEUROLOGIC: Moves all 4 extremities. PSYCHIATRIC: The patient is alert and oriented x 3.  SKIN: No obvious rash, lesion, or ulcer.   DATA REVIEW:   CBC  Recent Labs Lab 07/14/15 1222  WBC 11.2*  HGB 9.9*  HCT 30.1*  PLT 363    Chemistries   Recent Labs Lab 07/14/15 0426  NA 140  K 3.7  CL 107  CO2 28  GLUCOSE 96  BUN 23*  CREATININE 0.68  CALCIUM 8.6*  AST 19  ALT 11*  ALKPHOS 64  BILITOT 0.9    Cardiac Enzymes  Recent Labs Lab 07/14/15 1037  TROPONINI <0.03    Microbiology Results  No results found for this or any previous visit.  RADIOLOGY:  Dg Chest 2 View  07/13/2015  CLINICAL DATA:  Intermittent dizziness, shortness of Breath, syncope. EXAM: CHEST  2 VIEW COMPARISON:  None. FINDINGS: Heart and mediastinal contours are within normal limits. No focal opacities or effusions. No acute bony abnormality. IMPRESSION: No active cardiopulmonary  disease. Electronically Signed   By: Charlett Nose M.D.   On: 07/13/2015 21:14   Ct Head Wo Contrast  07/13/2015  CLINICAL DATA:  Dizziness with syncopal episode EXAM: CT HEAD WITHOUT CONTRAST TECHNIQUE: Contiguous axial images were obtained from the base of the skull through the vertex without intravenous contrast. COMPARISON:  None. FINDINGS: The ventricles are normal in size and configuration. There is no intracranial mass, hemorrhage, extra-axial fluid collection, or midline shift. The gray-white compartments are normal. No acute infarct evident. Bony calvarium appears intact. The visualized mastoid air cells are clear. No intraorbital lesions are identified. IMPRESSION: Study within normal limits. Electronically Signed   By: Bretta Bang III M.D.   On: 07/13/2015 21:24      Follow up with PCP in 1 week.  Management plans discussed with the patient, family and they are in agreement.  CODE STATUS:  Code Status History    Date Active Date Inactive Code Status Order ID Comments User Context   07/13/2015 10:32 PM 07/14/2015  5:28 PM Full Code [id number]  Ramonita Lab, MD Inpatient    Advance Directive Documentation        Most Recent Value   Type of Advance Directive  Healthcare Power of Attorney   Pre-existing out of facility DNR order (yellow form or pink MOST form)     "MOST" Form in Place?        TOTAL TIME TAKING CARE OF THIS PATIENT ON DAY OF DISCHARGE: more than 30 minutes.    Milagros Loll R M.D on 07/15/2015 at 6:12 PM  Between 7am to 6pm - Pager - [phone]  After 6pm go to [email or web address] - password [email or web address] [facility]  [facility]  Office  [phone]  CC: Primary care physician; No primary care provider on file.     Note: This dictation was prepared with Dragon dictation along with smaller phrase technology. Any transcriptional errors that result from this process are unintentional.

## 2015-07-19 ENCOUNTER — Telehealth: Payer: Self-pay | Admitting: Cardiovascular Disease

## 2015-07-19 DIAGNOSIS — R55 Syncope and collapse: Secondary | ICD-10-CM

## 2015-07-19 NOTE — Telephone Encounter (Signed)
I haven't gotten any orders on him and he hasn't been seen in clinic yet.  Do you want him to have a holter monitor or 30 day monitor?

## 2015-07-19 NOTE — Telephone Encounter (Signed)
Order placed w/ [employer].  They will contact pt to confirm address before shipping monitor.

## 2015-07-19 NOTE — Telephone Encounter (Signed)
Wife says patient was dc from [facility] for syncope and has not received 30 day monitor yet bc company does not have an order.  She wants to know if they can come pick a monitor up from office.   Patient is in meeting until 10 am.  Wife wants to be contacted.  Please call to discuss.

## 2015-07-19 NOTE — Telephone Encounter (Signed)
Yes, please order, 30 day event monitor. Dx: syncope.

## 2015-07-24 ENCOUNTER — Encounter (INDEPENDENT_AMBULATORY_CARE_PROVIDER_SITE_OTHER): Payer: BLUE CROSS/BLUE SHIELD

## 2015-07-24 DIAGNOSIS — R55 Syncope and collapse: Secondary | ICD-10-CM | POA: Diagnosis not present

## 2017-03-05 IMAGING — CR DG CHEST 2V
2 series · 2 of 2 positions shown · non-contrast
Comparison: None.

CLINICAL DATA: Intermittent dizziness, shortness of Breath,
syncope.

EXAM:
CHEST  2 VIEW

[chest pa]
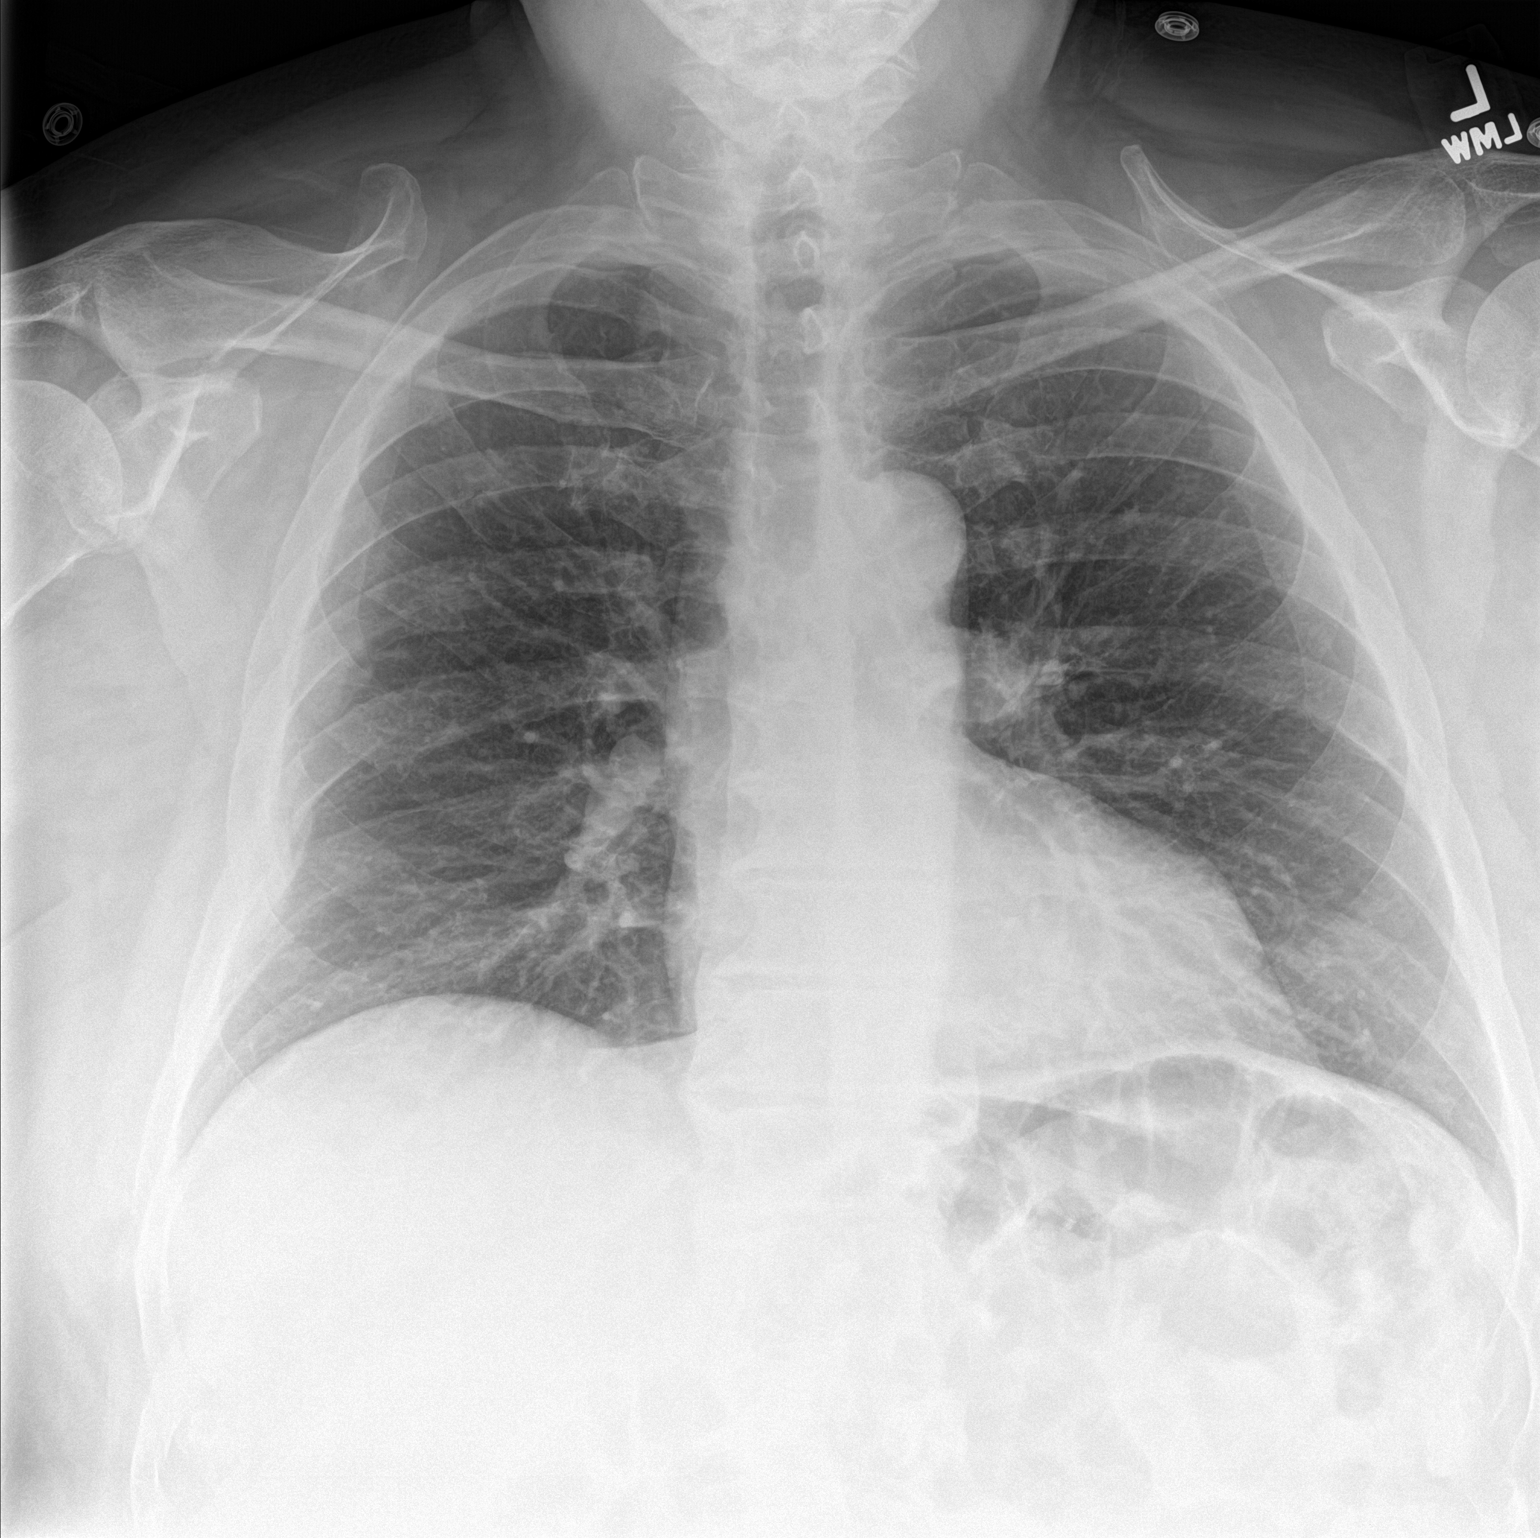

[chest lat]
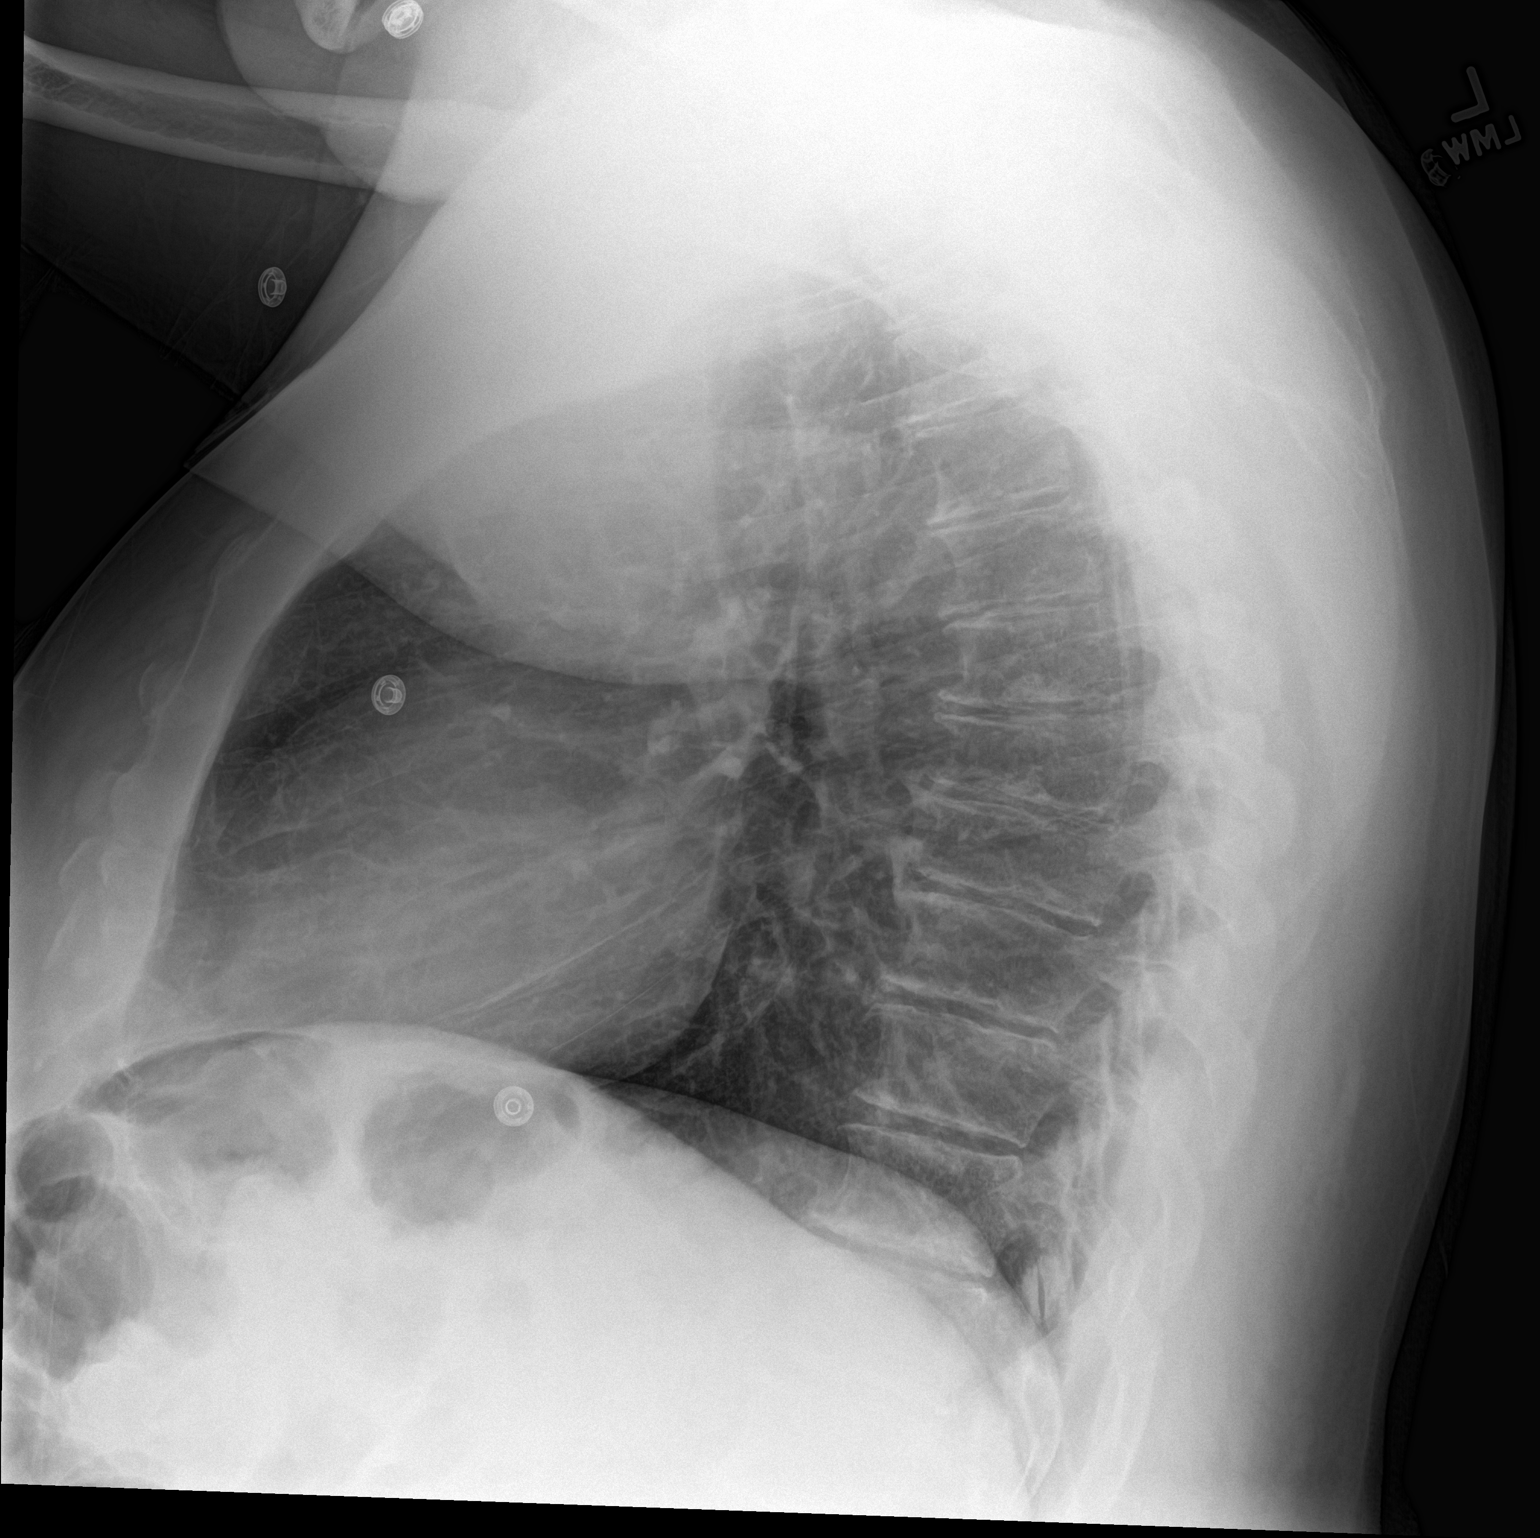

[2 of 2 positions shown; findings below may reference images not displayed]

FINDINGS: Heart and mediastinal contours are within normal limits. No focal
opacities or effusions. No acute bony abnormality.
IMPRESSION: No active cardiopulmonary disease.

## 2017-03-05 IMAGING — CT CT HEAD W/O CM
1 series · 16 of 30 positions shown, 20 images · non-contrast
Comparison: None.

CLINICAL DATA: Dizziness with syncopal episode

EXAM:
CT HEAD WITHOUT CONTRAST
TECHNIQUE: Contiguous axial images were obtained from the base of the skull
through the vertex without intravenous contrast.

[Series 2: head wo · axial · 0.47mm/px · z∈[-37,+111]mm · 16 of 36 slices shown, 20 images]
[im 2/36  brain]
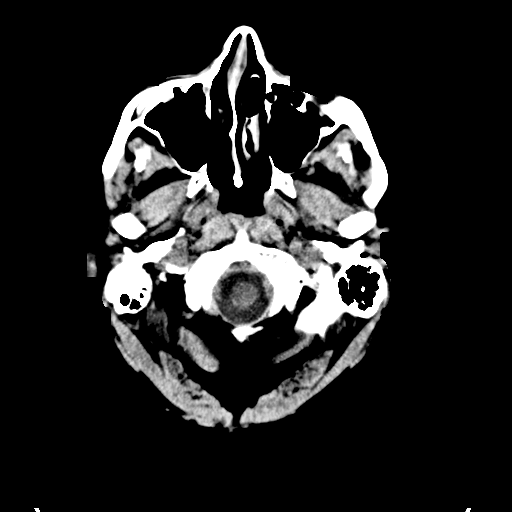
[im 2/36  bone]
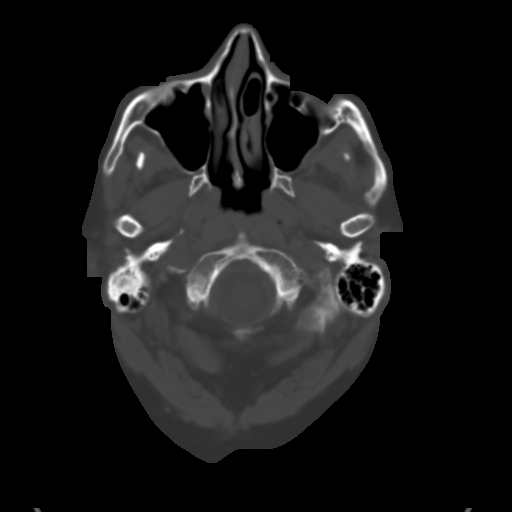
[im 4/36  brain]
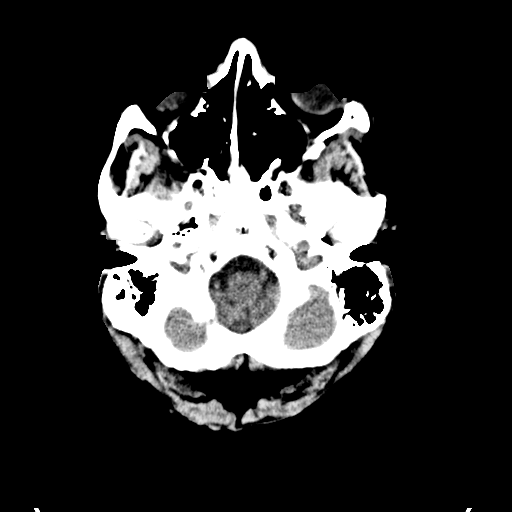
[im 7/36  brain]
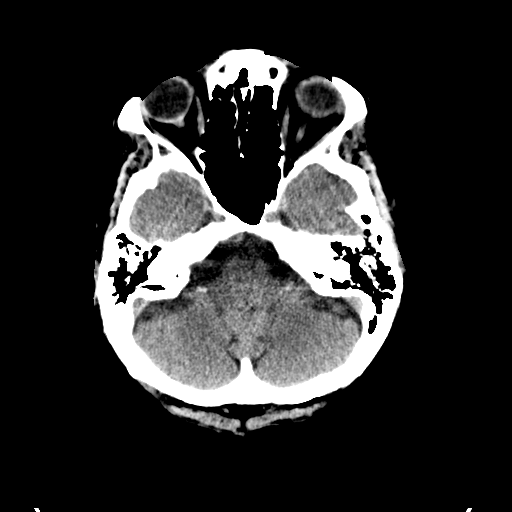
[im 9/36  brain]
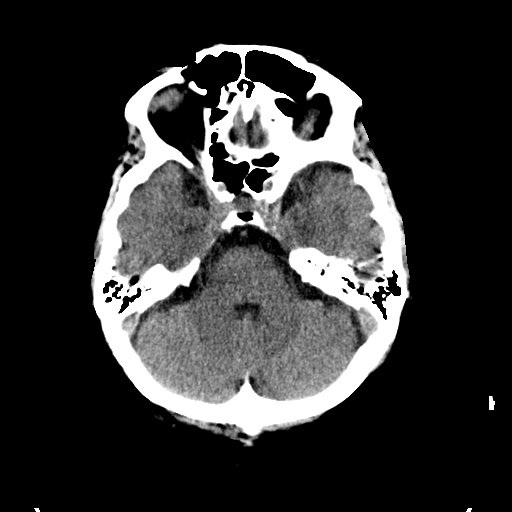
[im 10/36  brain]
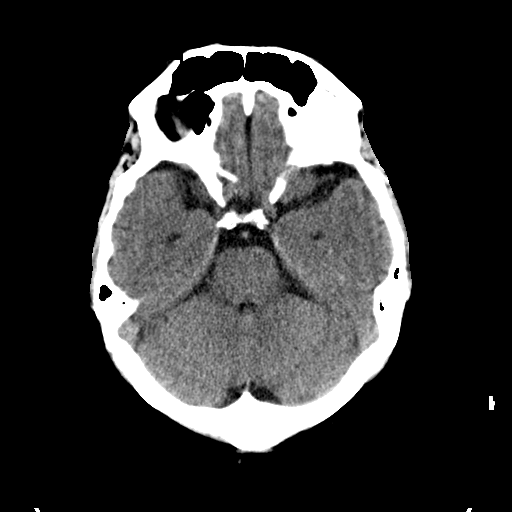
[im 10/36  bone]
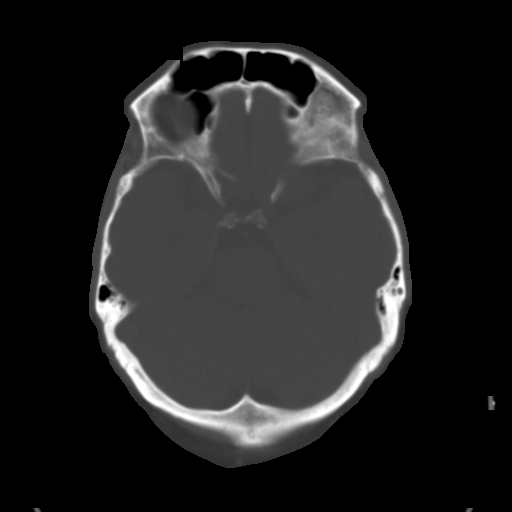
[im 13/36  brain]
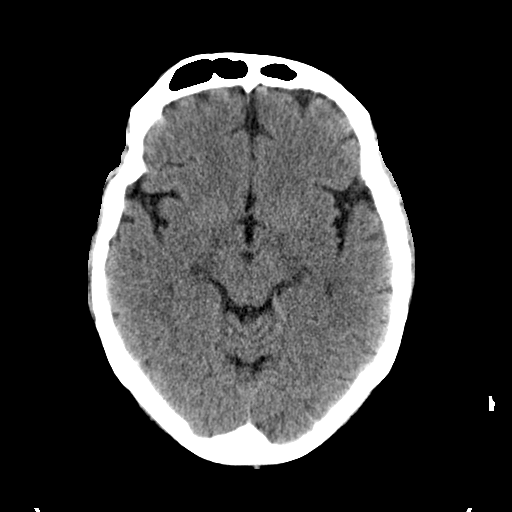
[im 15/36  brain]
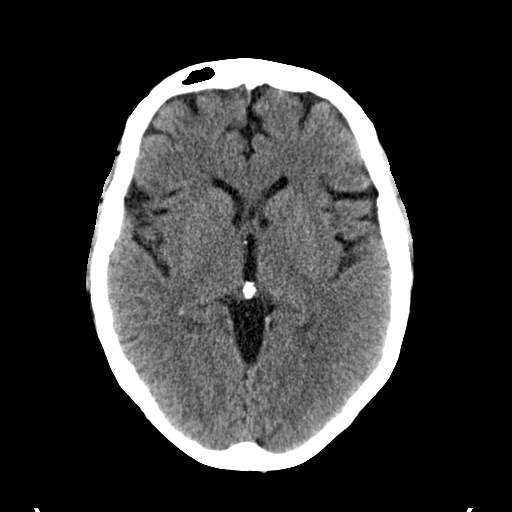
[im 17/36  brain]
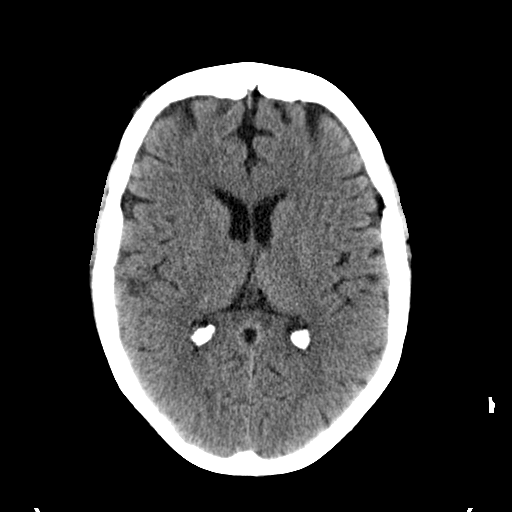
[im 19/36  brain]
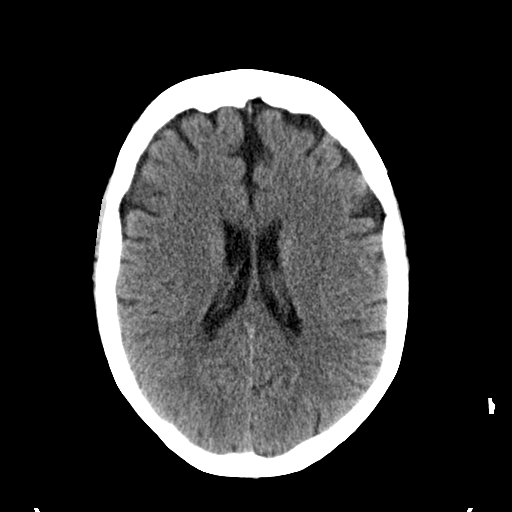
[im 19/36  bone]
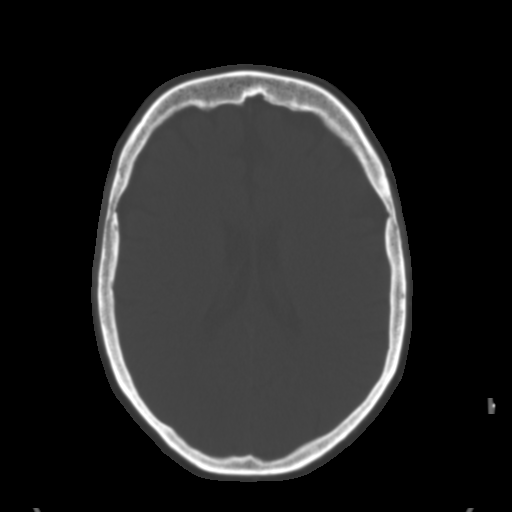
[im 21/36  brain]
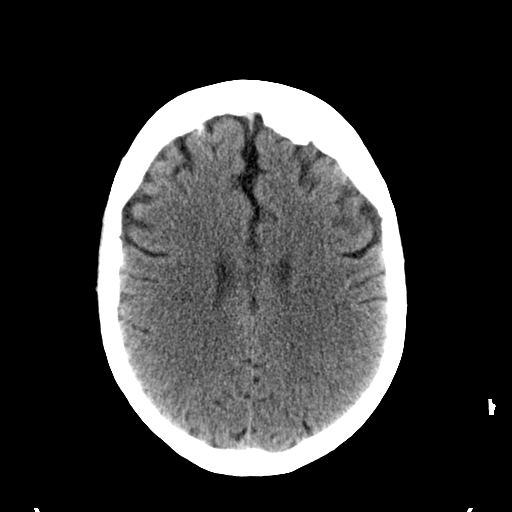
[im 23/36  brain]
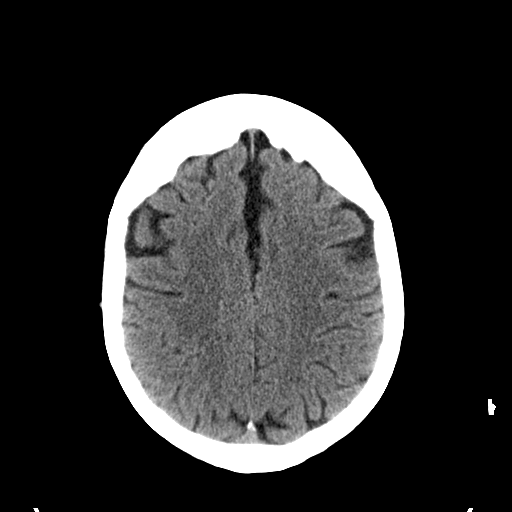
[im 26/36  brain]
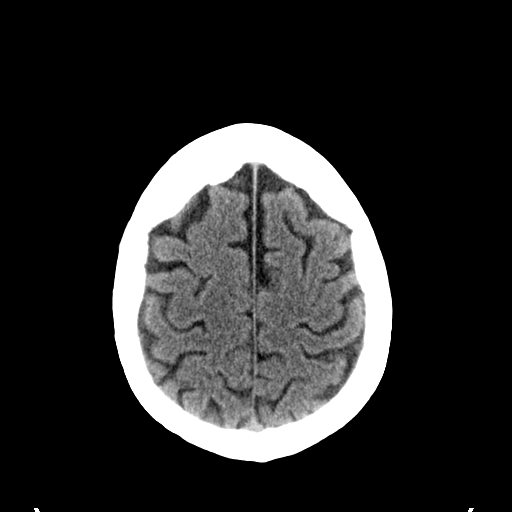
[im 27/36  brain]
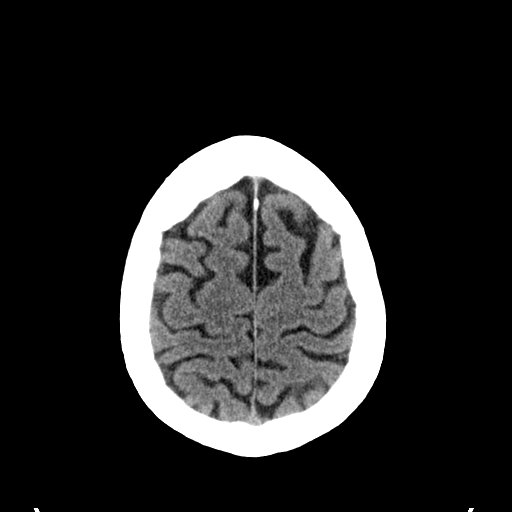
[im 27/36  bone]
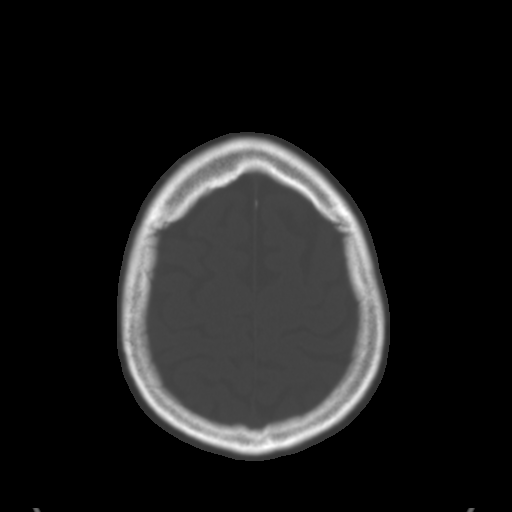
[im 29/36  brain]
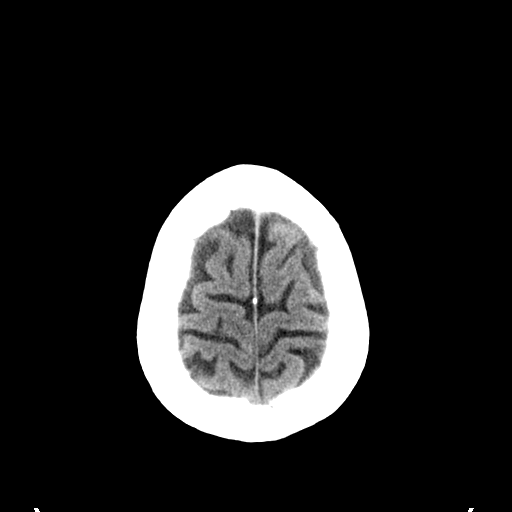
[im 32/36  brain]
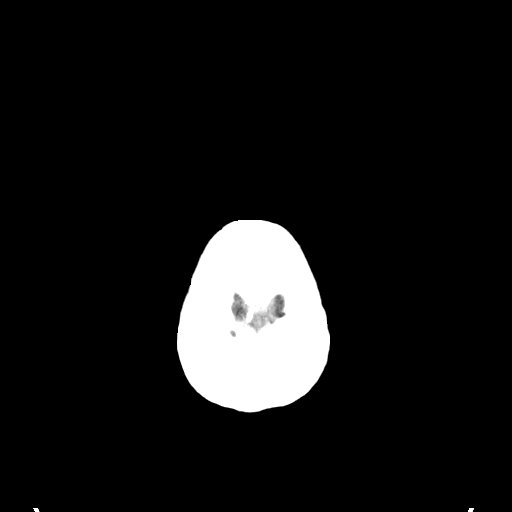
[im 34/36  brain]
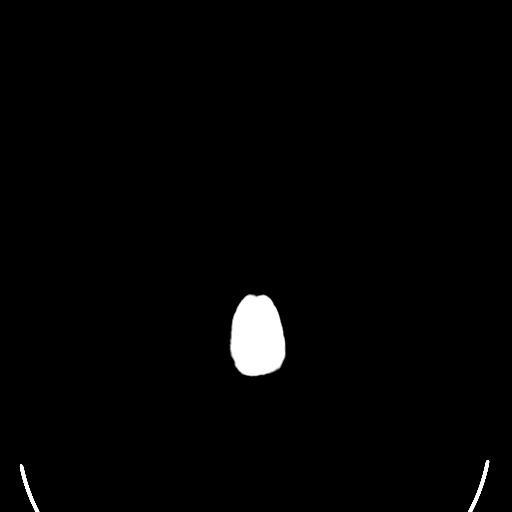

[16 of 30 positions shown; findings below may reference images not displayed]

FINDINGS: The ventricles are normal in size and configuration. There is no
intracranial mass, hemorrhage, extra-axial fluid collection, or
midline shift. The gray-white compartments are normal. No acute
infarct evident. Bony calvarium appears intact. The visualized
mastoid air cells are clear. No intraorbital lesions are identified.
IMPRESSION: Study within normal limits.
